# Patient Record
Sex: Male | Born: 1950 | Race: White | Hispanic: No | Marital: Married | Smoking: Former smoker
Health system: Southern US, Community
[De-identification: ages and names within clinical notes are randomized; demographics above are authoritative.]

## PROBLEM LIST (undated history)

## (undated) DIAGNOSIS — G4730 Sleep apnea, unspecified: Secondary | ICD-10-CM

## (undated) DIAGNOSIS — I10 Essential (primary) hypertension: Secondary | ICD-10-CM

## (undated) HISTORY — PX: PROSTATECTOMY: SHX69

## (undated) HISTORY — PX: HERNIA REPAIR: SHX51

---

## 2004-07-22 ENCOUNTER — Ambulatory Visit: Payer: Self-pay

## 2007-11-24 ENCOUNTER — Ambulatory Visit: Payer: Self-pay | Admitting: General Surgery

## 2008-10-11 ENCOUNTER — Ambulatory Visit: Payer: Self-pay | Admitting: *Deleted

## 2008-10-20 ENCOUNTER — Ambulatory Visit: Payer: Self-pay | Admitting: *Deleted

## 2008-10-26 ENCOUNTER — Ambulatory Visit: Payer: Self-pay | Admitting: *Deleted

## 2009-12-18 ENCOUNTER — Ambulatory Visit: Payer: Self-pay | Admitting: Surgery

## 2009-12-24 ENCOUNTER — Ambulatory Visit: Payer: Self-pay | Admitting: Surgery

## 2014-11-19 ENCOUNTER — Encounter: Payer: Self-pay | Admitting: General Practice

## 2014-11-19 ENCOUNTER — Emergency Department
Admission: EM | Admit: 2014-11-19 | Discharge: 2014-11-19 | Disposition: A | Discharge status: Home / Self Care | Payer: 59 | Attending: Emergency Medicine | Admitting: Emergency Medicine

## 2014-11-19 ENCOUNTER — Emergency Department: Payer: 59

## 2014-11-19 DIAGNOSIS — S61411A Laceration without foreign body of right hand, initial encounter: Secondary | ICD-10-CM | POA: Diagnosis not present

## 2014-11-19 DIAGNOSIS — T797XXA Traumatic subcutaneous emphysema, initial encounter: Secondary | ICD-10-CM | POA: Diagnosis not present

## 2014-11-19 DIAGNOSIS — Y998 Other external cause status: Secondary | ICD-10-CM | POA: Insufficient documentation

## 2014-11-19 DIAGNOSIS — I10 Essential (primary) hypertension: Secondary | ICD-10-CM | POA: Diagnosis not present

## 2014-11-19 DIAGNOSIS — Z23 Encounter for immunization: Secondary | ICD-10-CM | POA: Diagnosis not present

## 2014-11-19 DIAGNOSIS — Z87891 Personal history of nicotine dependence: Secondary | ICD-10-CM | POA: Diagnosis not present

## 2014-11-19 DIAGNOSIS — Y9389 Activity, other specified: Secondary | ICD-10-CM | POA: Insufficient documentation

## 2014-11-19 DIAGNOSIS — W228XXA Striking against or struck by other objects, initial encounter: Secondary | ICD-10-CM | POA: Insufficient documentation

## 2014-11-19 DIAGNOSIS — Y92009 Unspecified place in unspecified non-institutional (private) residence as the place of occurrence of the external cause: Secondary | ICD-10-CM | POA: Insufficient documentation

## 2014-11-19 HISTORY — DX: Essential (primary) hypertension: I10

## 2014-11-19 MED ORDER — LIDOCAINE HCL (PF) 1 % IJ SOLN
INTRAMUSCULAR | Status: AC
  Administered 2014-11-19: 2.1 mL
  Filled 2014-11-19: qty 5

## 2014-11-19 MED ORDER — BACITRACIN ZINC 500 UNIT/GM EX OINT
TOPICAL_OINTMENT | CUTANEOUS | Status: AC
  Administered 2014-11-19: 1
  Filled 2014-11-19: qty 1.8

## 2014-11-19 MED ORDER — CEFTRIAXONE SODIUM 1 G IJ SOLR
1.0000 g | Freq: Once | INTRAMUSCULAR | Status: AC
  Administered 2014-11-19: 1 g via INTRAMUSCULAR

## 2014-11-19 MED ORDER — CEPHALEXIN 500 MG PO CAPS: 500.0000 mg | ORAL_CAPSULE | Freq: Four times a day (QID) | ORAL | Status: AC

## 2014-11-19 MED ORDER — TETANUS-DIPHTH-ACELL PERTUSSIS 5-2.5-18.5 LF-MCG/0.5 IM SUSP
0.5000 mL | Freq: Once | INTRAMUSCULAR | Status: AC
  Administered 2014-11-19: 0.5 mL via INTRAMUSCULAR

## 2014-11-19 MED ORDER — IBUPROFEN 800 MG PO TABS: 800.0000 mg | ORAL_TABLET | Freq: Three times a day (TID) | ORAL | Status: AC | PRN

## 2014-11-19 MED ORDER — CEFTRIAXONE SODIUM 1 G IJ SOLR
INTRAMUSCULAR | Status: AC
  Administered 2014-11-19: 1 g via INTRAMUSCULAR
  Filled 2014-11-19: qty 10

## 2014-11-19 MED ORDER — TETANUS-DIPHTH-ACELL PERTUSSIS 5-2.5-18.5 LF-MCG/0.5 IM SUSP
INTRAMUSCULAR | Status: AC
  Administered 2014-11-19: 0.5 mL via INTRAMUSCULAR
  Filled 2014-11-19: qty 0.5

## 2014-11-19 NOTE — ED Notes (Signed)
Pt reports that he was at home pressure washing when his hand accident slip infront of machine. Laceration noted to top or Right hand. Bleeding controlled at this time. Alert and Oriented.

## 2014-11-19 NOTE — ED Notes (Signed)
NAD noted at time of D/C. Pt denies questions or concerns. Pt ambulatory to the lobby at this time.  

## 2014-11-19 NOTE — ED Provider Notes (Signed)
CSN: [id number]     Arrival date & time 11/19/14  1215 History   First MD Initiated Contact with Patient 11/19/14 1327     Chief Complaint  Patient presents with  . Laceration     (Consider location/radiation/quality/duration/timing/severity/associated sxs/prior Treatment) HPI  Prior to the patient's arrival in department he was cleaning his house was using a pressure washer his hand slipped in front of the gun opening up the laceration on the dorsal aspect of his right hand rates pain as about a 5 out of 10 any type of movement and closing his fist makes a little worse just holding it up and still makes it better mild swelling to the area no numbness tingling or weakness in the hand and no other complaints at this time   Past Medical History  Diagnosis Date  . Hypertension    History reviewed. No pertinent past surgical history. No family history on file. History  Substance Use Topics  . Smoking status: Former Research scientist (life sciences)  . Smokeless tobacco: Never Used  . Alcohol Use: Yes     Comment: casual    Review of Systems  Constitutional: Negative.   HENT: Negative.   Eyes: Negative.   Respiratory: Negative.   Cardiovascular: Negative.   Musculoskeletal: Negative.   Skin: Negative.   Neurological: Negative.   All other systems reviewed and are negative.     Allergies  Review of patient's allergies indicates no known allergies.  Home Medications   Prior to Admission medications   Medication Sig Start Date End Date Taking? Authorizing Provider  cephALEXin (KEFLEX) 500 MG capsule Take 1 capsule (500 mg total) by mouth 4 (four) times daily. 11/19/14 11/29/14  [place] Ganus William C Zechariah Bissonnette, PA-C  ibuprofen (ADVIL,MOTRIN) 800 MG tablet Take 1 tablet (800 mg total) by mouth every 8 (eight) hours as needed. 11/19/14   Eivan Gallina William C Zaylie Gisler, PA-C   BP 136/111 mmHg  Pulse 57  Temp(Src) 97.9 F (36.6 C)  Resp 18  Ht 6\' 2"  (1.88 m)  Wt 246 lb (111.585 kg)  BMI 31.57 kg/m2  SpO2  98% Physical Exam  Vitals reviewed  Constitutional: Alert and oriented. Well appearing and in no acute distress. Eyes: Conjunctivae are normal. PERRL. EOMI. Head: Atraumatic. Nose: No congestion/rhinnorhea. Mouth/Throat: Mucous membranes are moist.  Oropharynx non-erythematous. Neck: No stridor.   Cardiovascular: Normal rate, regular rhythm. Grossly normal heart sounds.  Good peripheral circulation. Respiratory: Normal respiratory effort.  No retractions. Lungs CTAB. Musculoskeletal: No lower extremity tenderness nor edema.  No joint effusions. Tenderness with palpation along the dorsal aspect of the right hand without palpable deformity or abnormality Neurologic:  Normal speech and language. No gross focal neurologic deficits are appreciated. Speech is normal. No gait instability. Skin:  Laceration to the dorsal aspect of his right hand no bleeding at this time mild swelling around the laceration Psychiatric: Mood and affect are normal. Speech and behavior are normal.   ED Course  Procedures (including critical care time) Labs Review Labs Reviewed - No data to display  Imaging Review Dg Hand Complete Right  11/19/2014   CLINICAL DATA:  Laceration to top of hand posteriorly. Initial encounter.  EXAM: RIGHT HAND - COMPLETE 3+ VIEW  COMPARISON:  None.  FINDINGS: Overlap of fingers on the lateral view. Soft tissue injury identified about the dorsal hand at the level of the carpals and metacarpals. No radiopaque foreign object.  IMPRESSION: Soft tissue injury dorsally, without acute osseous abnormality or radiopaque foreign object.   Electronically Signed  By: Abigail Miyamoto M.D.   On: 11/19/2014 13:52     EKG Interpretation None      MDM   Decision making on this patient he was at home pressure washing the pressure washer opened up the laceration to the dorsal aspect of his hand injecting water and air under the skin of his hand   Final diagnoses:  Laceration of right hand,  initial encounter  Subcutaneous emphysema, initial encounter        Pete Merten Verdene Rio, PA-C 11/19/14 [place], MD 11/22/14 1133

## 2015-08-10 ENCOUNTER — Encounter: Payer: Self-pay | Admitting: *Deleted

## 2015-08-13 ENCOUNTER — Encounter: Admission: RE | Payer: Self-pay | Source: Ambulatory Visit

## 2015-08-13 ENCOUNTER — Ambulatory Visit: Admission: RE | Admit: 2015-08-13 | Payer: 59 | Source: Ambulatory Visit | Admitting: Gastroenterology

## 2015-08-13 HISTORY — DX: Sleep apnea, unspecified: G47.30

## 2015-08-13 SURGERY — COLONOSCOPY WITH PROPOFOL
Anesthesia: General

## 2015-08-27 ENCOUNTER — Ambulatory Visit: Payer: 59 | Admitting: Anesthesiology

## 2015-08-27 ENCOUNTER — Ambulatory Visit
Admission: RE | Admit: 2015-08-27 | Discharge: 2015-08-27 | Disposition: A | Discharge status: Home / Self Care | Payer: 59 | Source: Ambulatory Visit | Attending: Gastroenterology | Admitting: Gastroenterology

## 2015-08-27 ENCOUNTER — Encounter
Admission: RE | Disposition: A | Discharge status: Home / Self Care | Payer: Self-pay | Source: Ambulatory Visit | Attending: Gastroenterology

## 2015-08-27 DIAGNOSIS — Z86010 Personal history of colonic polyps: Secondary | ICD-10-CM | POA: Diagnosis present

## 2015-08-27 DIAGNOSIS — Z1211 Encounter for screening for malignant neoplasm of colon: Secondary | ICD-10-CM | POA: Insufficient documentation

## 2015-08-27 DIAGNOSIS — Z791 Long term (current) use of non-steroidal anti-inflammatories (NSAID): Secondary | ICD-10-CM | POA: Diagnosis not present

## 2015-08-27 DIAGNOSIS — G4730 Sleep apnea, unspecified: Secondary | ICD-10-CM | POA: Insufficient documentation

## 2015-08-27 DIAGNOSIS — Z79899 Other long term (current) drug therapy: Secondary | ICD-10-CM | POA: Diagnosis not present

## 2015-08-27 DIAGNOSIS — Z87891 Personal history of nicotine dependence: Secondary | ICD-10-CM | POA: Diagnosis not present

## 2015-08-27 DIAGNOSIS — Z800 Family history of malignant neoplasm of digestive organs: Secondary | ICD-10-CM | POA: Diagnosis not present

## 2015-08-27 DIAGNOSIS — K5730 Diverticulosis of large intestine without perforation or abscess without bleeding: Secondary | ICD-10-CM | POA: Diagnosis not present

## 2015-08-27 DIAGNOSIS — Z9079 Acquired absence of other genital organ(s): Secondary | ICD-10-CM | POA: Diagnosis not present

## 2015-08-27 DIAGNOSIS — I10 Essential (primary) hypertension: Secondary | ICD-10-CM | POA: Insufficient documentation

## 2015-08-27 DIAGNOSIS — Z7951 Long term (current) use of inhaled steroids: Secondary | ICD-10-CM | POA: Diagnosis not present

## 2015-08-27 DIAGNOSIS — D123 Benign neoplasm of transverse colon: Secondary | ICD-10-CM | POA: Diagnosis not present

## 2015-08-27 HISTORY — PX: COLONOSCOPY WITH PROPOFOL: SHX5780

## 2015-08-27 SURGERY — COLONOSCOPY WITH PROPOFOL
Anesthesia: General

## 2015-08-27 MED ORDER — PROPOFOL 500 MG/50ML IV EMUL
INTRAVENOUS | Status: DC | PRN
Start: 2015-08-27 — End: 2015-08-27
  Administered 2015-08-27: 175 ug/kg/min via INTRAVENOUS

## 2015-08-27 MED ORDER — SODIUM CHLORIDE 0.9 % IV SOLN
INTRAVENOUS | Status: DC
  Administered 2015-08-27: 1000 mL via INTRAVENOUS

## 2015-08-27 MED ORDER — MIDAZOLAM HCL 2 MG/2ML IJ SOLN
INTRAMUSCULAR | Status: DC | PRN
  Administered 2015-08-27 (×2): 1 mg via INTRAVENOUS

## 2015-08-27 MED ORDER — LIDOCAINE HCL (CARDIAC) 20 MG/ML IV SOLN
INTRAVENOUS | Status: DC | PRN
  Administered 2015-08-27: 40 mg via INTRAVENOUS

## 2015-08-27 MED ORDER — FENTANYL CITRATE (PF) 100 MCG/2ML IJ SOLN
INTRAMUSCULAR | Status: DC | PRN
  Administered 2015-08-27: 50 ug via INTRAVENOUS

## 2015-08-27 MED ORDER — PROPOFOL 10 MG/ML IV BOLUS
INTRAVENOUS | Status: DC | PRN
  Administered 2015-08-27: 30 mg via INTRAVENOUS
  Administered 2015-08-27: 50 mg via INTRAVENOUS

## 2015-08-27 NOTE — Anesthesia Postprocedure Evaluation (Signed)
Anesthesia Post Note  Patient: Bradley Malone  Procedure(s) Performed: Procedure(s) (LRB): COLONOSCOPY WITH PROPOFOL (N/A)  Patient location during evaluation: Endoscopy Anesthesia Type: General Level of consciousness: awake and alert Pain management: pain level controlled Vital Signs Assessment: post-procedure vital signs reviewed and stable Respiratory status: spontaneous breathing, nonlabored ventilation, respiratory function stable and patient connected to nasal cannula oxygen Cardiovascular status: blood pressure returned to baseline and stable Postop Assessment: no signs of nausea or vomiting Anesthetic complications: no    Last Vitals:  Filed Vitals:   08/27/15 1017 08/27/15 1027  BP: 113/86 126/88  Pulse: 60 56  Temp:    Resp: 13 15    Last Pain: There were no vitals filed for this visit.               Martha Clan

## 2015-08-27 NOTE — Op Note (Signed)
[facility] Patient Name: Bradley Malone Procedure Date: 08/27/2015 9:24 AM MRN: [id number] Account #: [id number] Date of Birth: Oct 24, 1950 Admit Type: Outpatient Age: 65 Room: [facility] ENDO ROOM 3 Gender: Male Note Status: Finalized Procedure:            Colonoscopy Indications:          Surveillance: Personal history of adenomatous polyps on                        last colonoscopy > 5 years ago, Family history of colon                        cancer in a distant relative, Family history of colonic                        polyps in a first-degree relative Patient Profile:      This is a 65 year old male. Providers:            Gerrit Heck. Rayann Heman, MD Referring MD:         Shirline Frees (Referring MD) Medicines:            Propofol per Anesthesia Complications:        No immediate complications. Procedure:            Pre-Anesthesia Assessment:                       - Prior to the procedure, a History and Physical was                        performed, and patient medications, allergies and                        sensitivities were reviewed. The patient's tolerance of                        previous anesthesia was reviewed.                       After obtaining informed consent, the colonoscope was                        passed under direct vision. Throughout the procedure,                        the patient's blood pressure, pulse, and oxygen                        saturations were monitored continuously. The                        Colonoscope was introduced through the anus and                        advanced to the the cecum, identified by appendiceal                        orifice and ileocecal valve. The colonoscopy was                        performed without difficulty. The patient  tolerated the                        procedure well. The quality of the bowel preparation                        was good. Findings:      The perianal and digital rectal  examinations were normal.      A 3 mm polyp was found in the transverse colon. The polyp was sessile.       The polyp was removed with a jumbo cold forceps. Resection and retrieval       were complete.      A few small-mouthed diverticula were found in the sigmoid colon.      Anal papilla(e) were hypertrophied.      The exam was otherwise without abnormality. Impression:           - One 3 mm polyp in the transverse colon, removed with                        a jumbo cold forceps. Resected and retrieved.                       - Diverticulosis in the sigmoid colon.                       - Anal papilla(e) were hypertrophied.                       - The examination was otherwise normal. Recommendation:       - Observe patient in GI recovery unit.                       - Continue present medications.                       - Await pathology results.                       - Repeat colonoscopy for surveillance based on                        pathology results.                       - Return to referring physician.                       - The findings and recommendations were discussed with                        the patient.                       - The findings and recommendations were discussed with                        the patient's family. Procedure Code(s):    --- Professional ---                       [phone], Colonoscopy, flexible; with biopsy, single or  multiple Diagnosis Code(s):    --- Professional ---                       Z86.010, Personal history of colonic polyps                       D12.3, Benign neoplasm of transverse colon (hepatic                        flexure or splenic flexure)                       K62.89, Other specified diseases of anus and rectum                       Z80.0, Family history of malignant neoplasm of                        digestive organs                       Z83.71, Family history of colonic polyps                       K57.30,  Diverticulosis of large intestine without                        perforation or abscess without bleeding CPT copyright 2016 American [employer]. All rights reserved. The codes documented in this report are preliminary and upon coder review may  be revised to meet current compliance requirements. Mellody Life, MD 08/27/2015 9:55:59 AM This report has been signed electronically. Number of Addenda: 0 Note Initiated On: 08/27/2015 9:24 AM Scope Withdrawal Time: 0 hours 15 minutes 14 seconds  Total Procedure Duration: 0 hours 18 minutes 39 seconds       [facility]

## 2015-08-27 NOTE — Transfer of Care (Signed)
Immediate Anesthesia Transfer of Care Note  Patient: Bradley Malone  Procedure(s) Performed: Procedure(s): COLONOSCOPY WITH PROPOFOL (N/A)  Patient Location: PACU and Endoscopy Unit  Anesthesia Type:General  Level of Consciousness: sedated  Airway & Oxygen Therapy: Patient Spontanous Breathing and Patient connected to nasal cannula oxygen  Post-op Assessment: Report given to RN and Post -op Vital signs reviewed and stable  Post vital signs: Reviewed and stable  Last Vitals:  Filed Vitals:   08/27/15 0903 08/27/15 0958  BP: 135/91 92/46  Pulse: 71 61  Temp: 37.3 C 36.2 C  Resp: 16 16    Complications: No apparent anesthesia complications

## 2015-08-27 NOTE — Anesthesia Procedure Notes (Signed)
Date/Time: 08/27/2015 9:26 AM Performed by: Doreen Salvage Pre-anesthesia Checklist: Patient identified, Emergency Drugs available, Suction available and Patient being monitored Patient Re-evaluated:Patient Re-evaluated prior to inductionOxygen Delivery Method: Nasal cannula Intubation Type: IV induction Dental Injury: Teeth and Oropharynx as per pre-operative assessment  Comments: Nasal cannula with etCO2 monitoring

## 2015-08-27 NOTE — Discharge Instructions (Signed)

## 2015-08-27 NOTE — Anesthesia Preprocedure Evaluation (Signed)
Anesthesia Evaluation  Patient identified by MRN, date of birth, ID band Patient awake    Reviewed: Allergy & Precautions, H&P , NPO status , Patient's Chart, lab work & pertinent test results, reviewed documented beta blocker date and time   History of Anesthesia Complications Negative for: history of anesthetic complications  Airway Mallampati: III  TM Distance: >3 FB Neck ROM: full    Dental no notable dental hx. (+) Caps   Pulmonary neg shortness of breath, sleep apnea and Continuous Positive Airway Pressure Ventilation , neg COPD, Recent URI , former smoker,    Pulmonary exam normal breath sounds clear to auscultation       Cardiovascular Exercise Tolerance: Good hypertension, (-) angina(-) CAD, (-) Past MI, (-) Cardiac Stents and (-) CABG Normal cardiovascular exam(-) dysrhythmias (-) Valvular Problems/Murmurs Rhythm:regular Rate:Normal     Neuro/Psych negative neurological ROS  negative psych ROS   GI/Hepatic negative GI ROS, Neg liver ROS,   Endo/Other  negative endocrine ROS  Renal/GU negative Renal ROS  negative genitourinary   Musculoskeletal   Abdominal   Peds  Hematology negative hematology ROS (+)   Anesthesia Other Findings Past Medical History:   Hypertension                                                 Sleep apnea                                                    Comment:uses CPAP   Reproductive/Obstetrics negative OB ROS                             Anesthesia Physical Anesthesia Plan  ASA: II  Anesthesia Plan: General   Post-op Pain Management:    Induction:   Airway Management Planned:   Additional Equipment:   Intra-op Plan:   Post-operative Plan:   Informed Consent: I have reviewed the patients History and Physical, chart, labs and discussed the procedure including the risks, benefits and alternatives for the proposed anesthesia with the patient or  authorized representative who has indicated his/her understanding and acceptance.   Dental Advisory Given  Plan Discussed with: Anesthesiologist, CRNA and Surgeon  Anesthesia Plan Comments:         Anesthesia Quick Evaluation

## 2015-08-27 NOTE — H&P (Signed)
  Primary Care Physician:  Sherrin Daisy, MD  Pre-Procedure History & Physical: HPI:  Bradley Malone is a 65 y.o. male is here for an colonoscopy.   Past Medical History  Diagnosis Date  . Hypertension   . Sleep apnea     uses CPAP    Past Surgical History  Procedure Laterality Date  . Prostatectomy    . Hernia repair      x2    Prior to Admission medications   Medication Sig Start Date End Date Taking? Authorizing Provider  citalopram (CELEXA) 20 MG tablet Take 20 mg by mouth daily.    Historical Provider, MD  fluticasone (FLONASE) 50 MCG/ACT nasal spray Place into both nostrils daily.    Historical Provider, MD  ibuprofen (ADVIL,MOTRIN) 800 MG tablet Take 1 tablet (800 mg total) by mouth every 8 (eight) hours as needed. 11/19/14   III William C Ruffian, PA-C  lisinopril-hydrochlorothiazide (PRINZIDE,ZESTORETIC) 20-25 MG tablet Take 1 tablet by mouth daily.    Historical Provider, MD  Multiple Vitamins-Minerals (MULTIVITAMIN ADULT PO) Take by mouth.    Historical Provider, MD    Allergies as of 08/15/2015  . (No Known Allergies)    History reviewed. No pertinent family history.  Social History   Social History  . Marital Status: Married    Spouse Name: N/A  . Number of Children: N/A  . Years of Education: N/A   Occupational History  . Not on file.   Social History Main Topics  . Smoking status: Former Research scientist (life sciences)  . Smokeless tobacco: Never Used  . Alcohol Use: Yes     Comment: casual  . Drug Use: No  . Sexual Activity: Not on file   Other Topics Concern  . Not on file   Social History Narrative     Physical Exam: BP 135/91 mmHg  Pulse 71  Temp(Src) 99.1 F (37.3 C) (Tympanic)  Resp 16  Ht 6' 2.5" (1.892 m)  Wt 113.399 kg (250 lb)  BMI 31.68 kg/m2  SpO2 99% General:   Alert,  pleasant and cooperative in NAD Head:  Normocephalic and atraumatic. Neck:  Supple; no masses or thyromegaly. Lungs:  Clear throughout to auscultation.    Heart:  Regular  rate and rhythm. Abdomen:  Soft, nontender and nondistended. Normal bowel sounds, without guarding, and without rebound.   Neurologic:  Alert and  oriented x4;  grossly normal neurologically.  Impression/Plan: Bradley Malone is here for an colonoscopy to be performed for polyp surveillance  Risks, benefits, limitations, and alternatives regarding  colonoscopy have been reviewed with the patient.  Questions have been answered.  All parties agreeable.   Josefine Class, MD  08/27/2015, 9:22 AM

## 2015-08-28 LAB — SURGICAL PATHOLOGY

## 2015-08-29 ENCOUNTER — Encounter: Payer: Self-pay | Admitting: Gastroenterology

## 2016-07-22 ENCOUNTER — Ambulatory Visit: Payer: 59 | Admitting: Dietician

## 2016-09-07 IMAGING — CR DG HAND COMPLETE 3+V*R*
1 series · 3 of 3 positions shown · non-contrast
Comparison: None.

CLINICAL DATA: Laceration to top of hand posteriorly. Initial
encounter.

EXAM:
RIGHT HAND - COMPLETE 3+ VIEW

[Series 1: pa · 0.17mm/px · 3 of 3 slices shown]
[im 1/3]
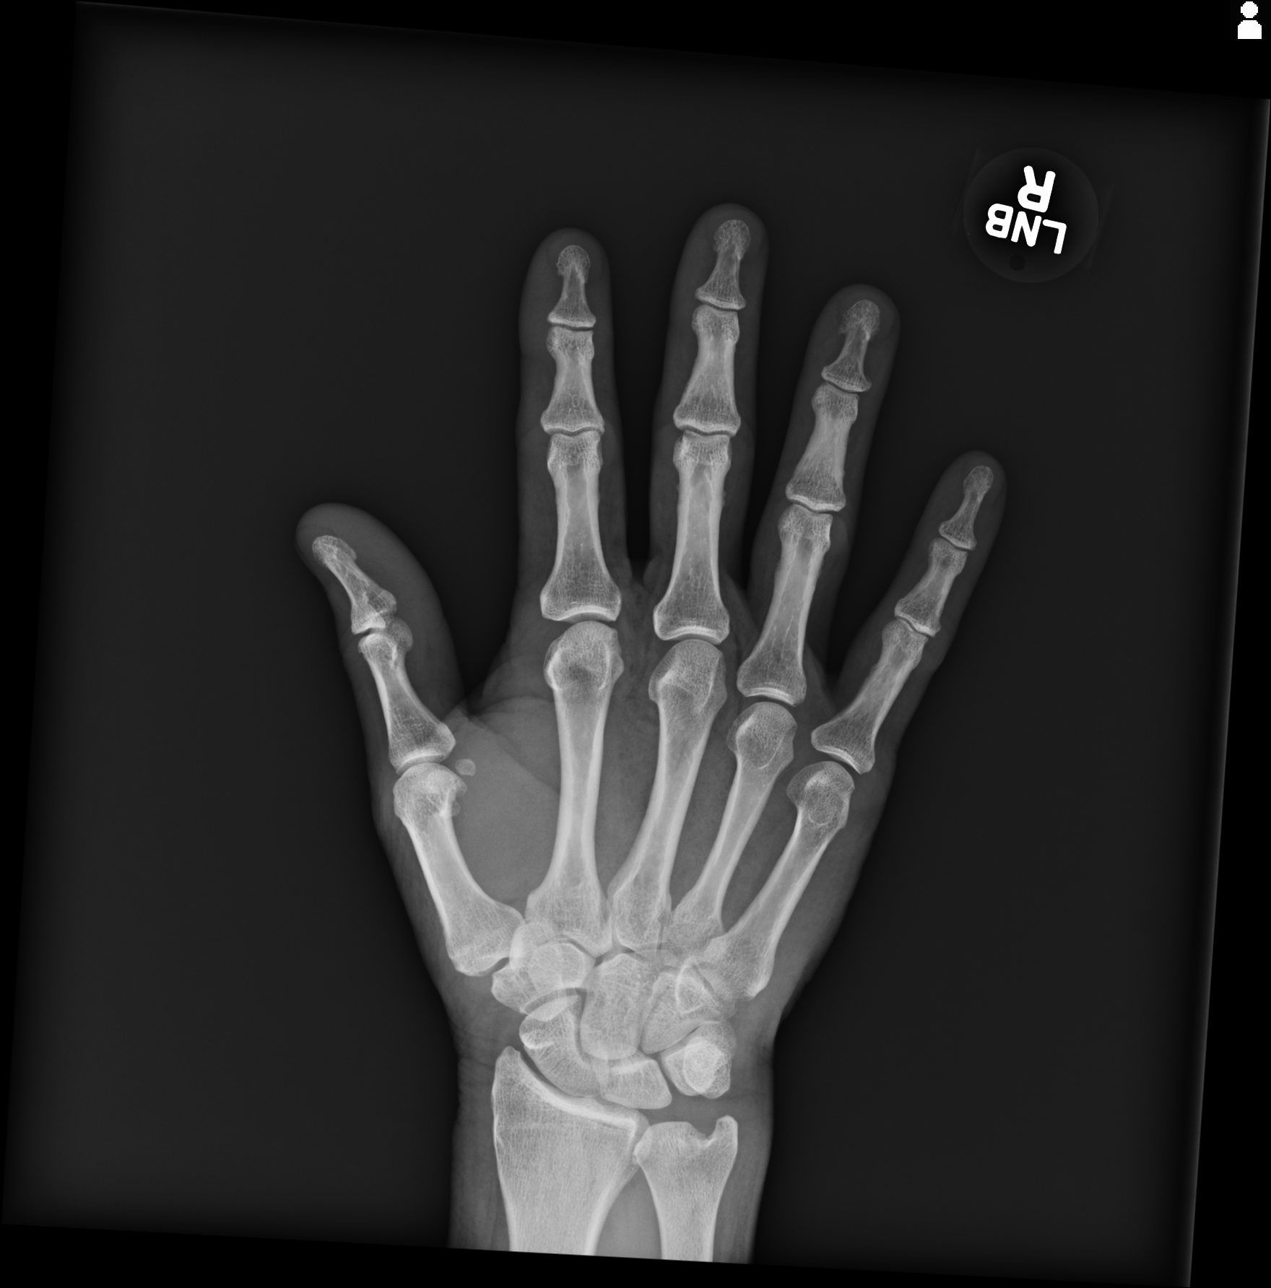
[im 2/3]
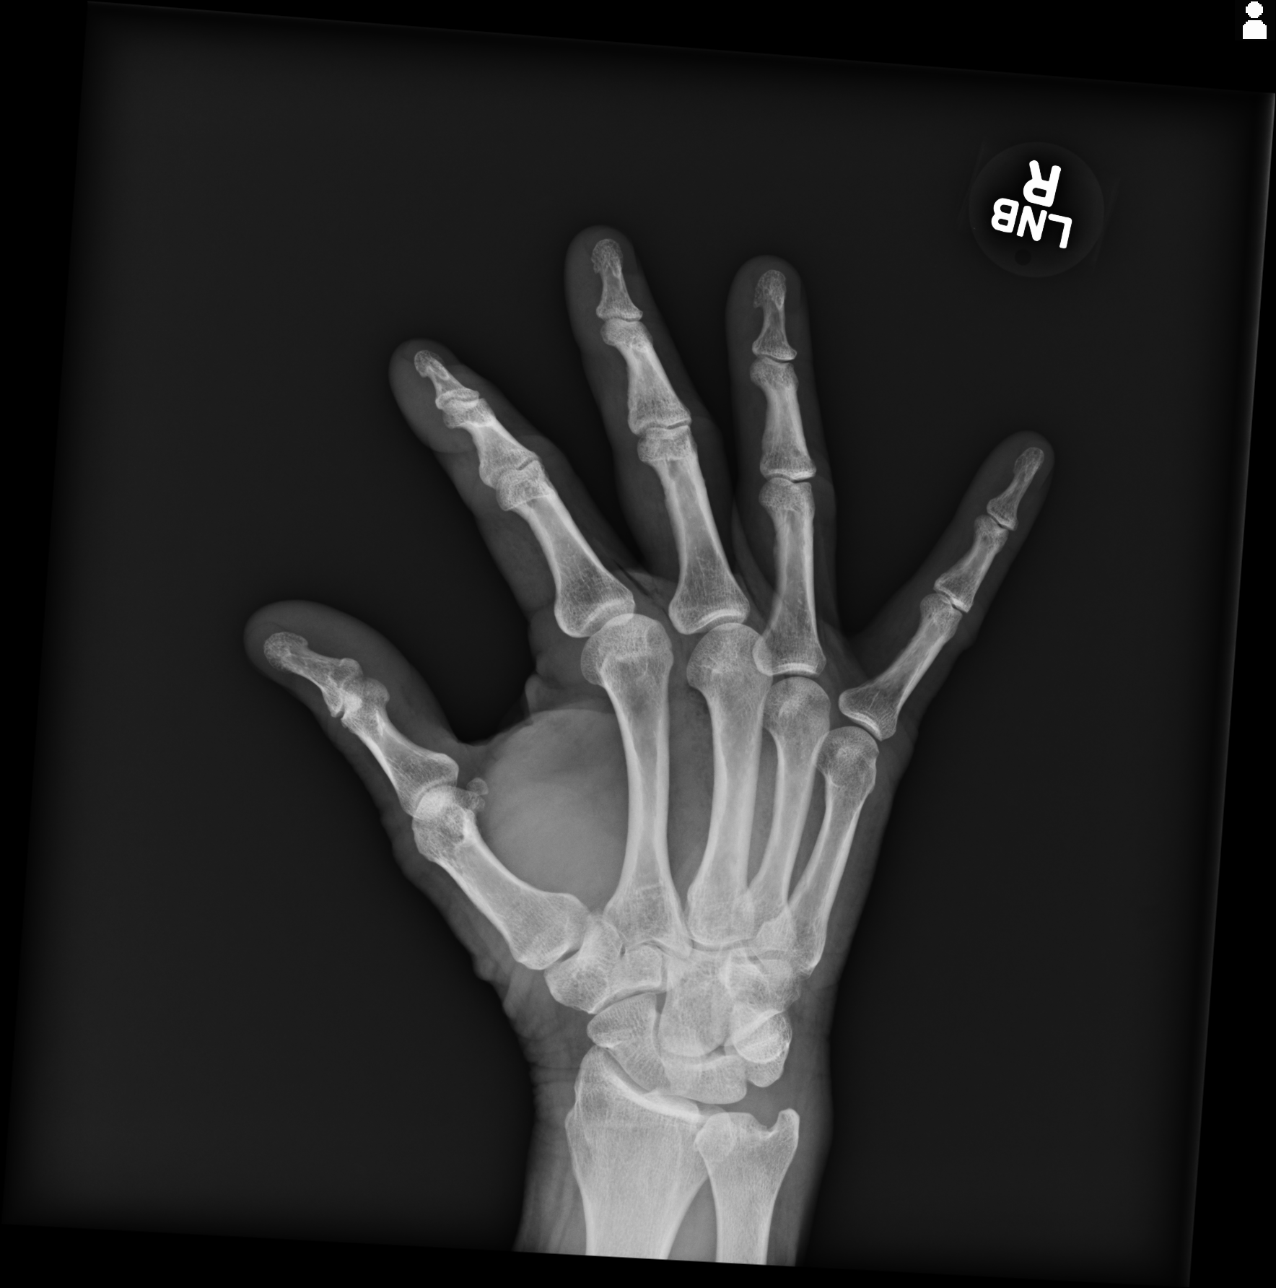
[im 3/3]
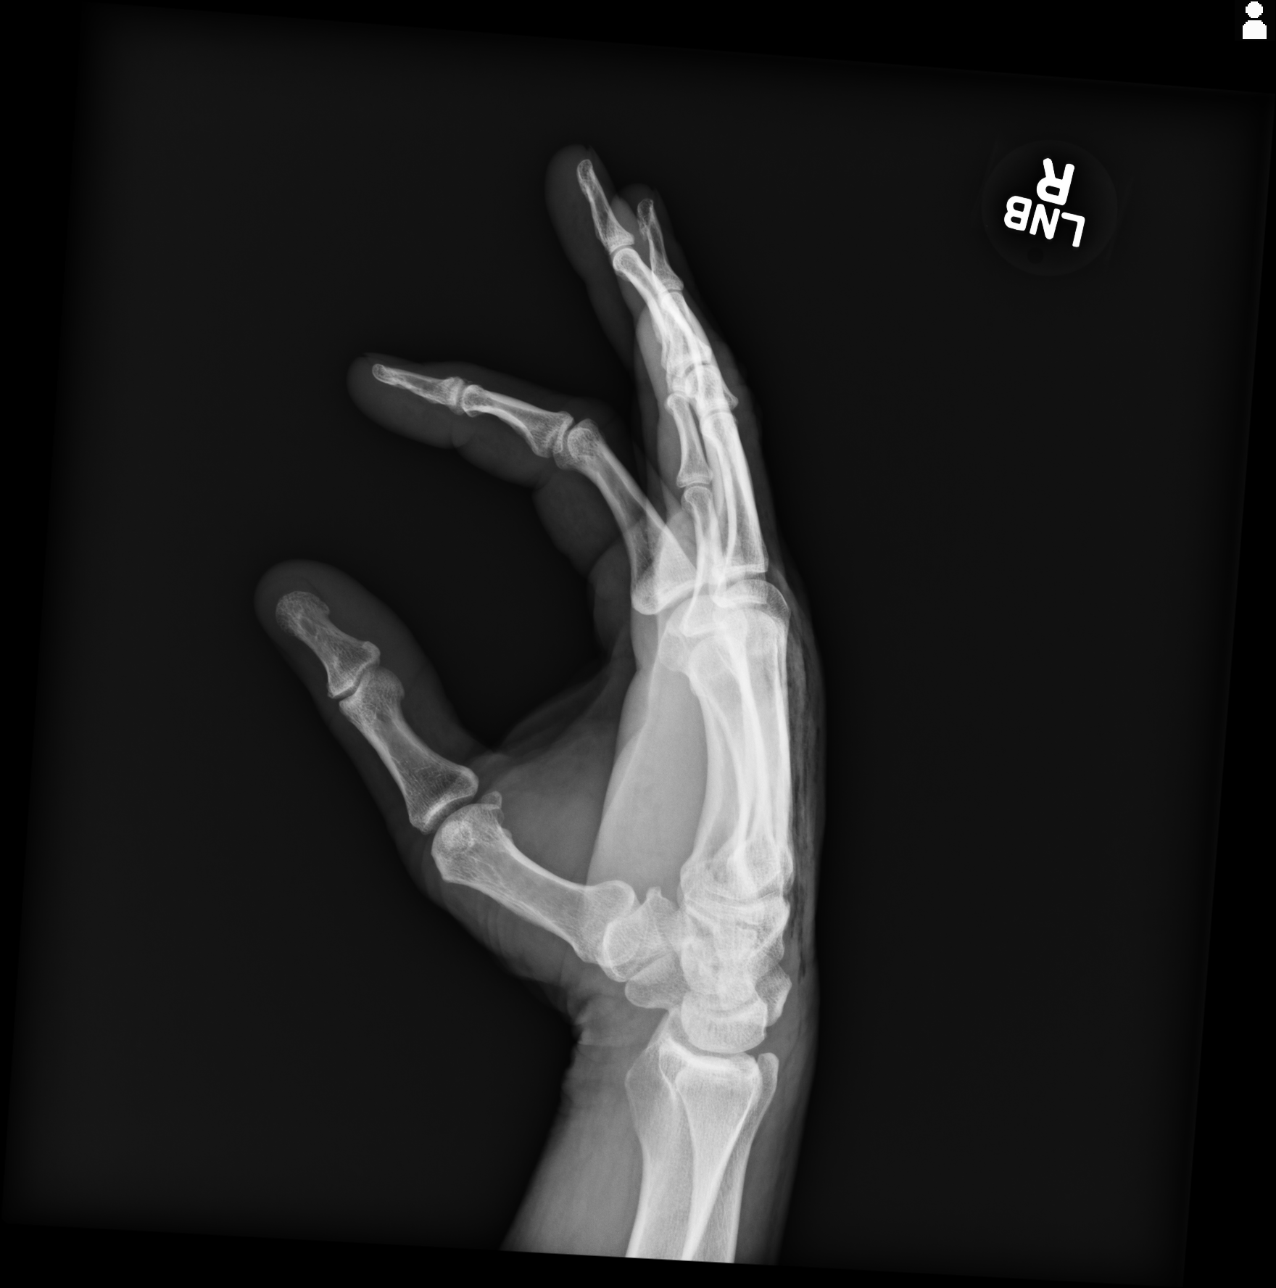

[3 of 3 positions shown; findings below may reference images not displayed]

FINDINGS: Overlap of fingers on the lateral view. Soft tissue injury
identified about the dorsal hand at the level of the carpals and
metacarpals. No radiopaque foreign object.
IMPRESSION: Soft tissue injury dorsally, without acute osseous abnormality or
radiopaque foreign object.

## 2016-10-15 DIAGNOSIS — C44622 Squamous cell carcinoma of skin of right upper limb, including shoulder: Secondary | ICD-10-CM | POA: Diagnosis not present

## 2016-11-07 DIAGNOSIS — E119 Type 2 diabetes mellitus without complications: Secondary | ICD-10-CM | POA: Diagnosis not present

## 2017-01-26 DIAGNOSIS — Z23 Encounter for immunization: Secondary | ICD-10-CM | POA: Diagnosis not present

## 2017-01-26 DIAGNOSIS — M7022 Olecranon bursitis, left elbow: Secondary | ICD-10-CM | POA: Diagnosis not present

## 2017-04-14 DIAGNOSIS — E7800 Pure hypercholesterolemia, unspecified: Secondary | ICD-10-CM | POA: Diagnosis not present

## 2017-04-14 DIAGNOSIS — G4733 Obstructive sleep apnea (adult) (pediatric): Secondary | ICD-10-CM | POA: Diagnosis not present

## 2017-04-14 DIAGNOSIS — Z1159 Encounter for screening for other viral diseases: Secondary | ICD-10-CM | POA: Diagnosis not present

## 2017-04-14 DIAGNOSIS — E1165 Type 2 diabetes mellitus with hyperglycemia: Secondary | ICD-10-CM | POA: Diagnosis not present

## 2017-04-14 DIAGNOSIS — Z9989 Dependence on other enabling machines and devices: Secondary | ICD-10-CM | POA: Diagnosis not present

## 2017-04-14 DIAGNOSIS — I10 Essential (primary) hypertension: Secondary | ICD-10-CM | POA: Diagnosis not present

## 2017-04-14 DIAGNOSIS — M7022 Olecranon bursitis, left elbow: Secondary | ICD-10-CM | POA: Diagnosis not present

## 2017-04-14 DIAGNOSIS — Z79899 Other long term (current) drug therapy: Secondary | ICD-10-CM | POA: Diagnosis not present

## 2017-04-14 DIAGNOSIS — Z114 Encounter for screening for human immunodeficiency virus [HIV]: Secondary | ICD-10-CM | POA: Diagnosis not present

## 2017-04-14 DIAGNOSIS — Z23 Encounter for immunization: Secondary | ICD-10-CM | POA: Diagnosis not present

## 2017-04-14 DIAGNOSIS — F419 Anxiety disorder, unspecified: Secondary | ICD-10-CM | POA: Diagnosis not present

## 2017-05-15 DIAGNOSIS — E119 Type 2 diabetes mellitus without complications: Secondary | ICD-10-CM | POA: Diagnosis not present

## 2017-08-17 DIAGNOSIS — M531 Cervicobrachial syndrome: Secondary | ICD-10-CM | POA: Diagnosis not present

## 2017-08-17 DIAGNOSIS — M9901 Segmental and somatic dysfunction of cervical region: Secondary | ICD-10-CM | POA: Diagnosis not present

## 2017-08-21 DIAGNOSIS — M9901 Segmental and somatic dysfunction of cervical region: Secondary | ICD-10-CM | POA: Diagnosis not present

## 2017-08-21 DIAGNOSIS — M531 Cervicobrachial syndrome: Secondary | ICD-10-CM | POA: Diagnosis not present

## 2017-09-08 DIAGNOSIS — J0100 Acute maxillary sinusitis, unspecified: Secondary | ICD-10-CM | POA: Diagnosis not present

## 2017-10-14 DIAGNOSIS — Z86018 Personal history of other benign neoplasm: Secondary | ICD-10-CM | POA: Diagnosis not present

## 2017-10-14 DIAGNOSIS — L570 Actinic keratosis: Secondary | ICD-10-CM | POA: Diagnosis not present

## 2017-10-14 DIAGNOSIS — L578 Other skin changes due to chronic exposure to nonionizing radiation: Secondary | ICD-10-CM | POA: Diagnosis not present

## 2017-10-14 DIAGNOSIS — Z859 Personal history of malignant neoplasm, unspecified: Secondary | ICD-10-CM | POA: Diagnosis not present

## 2017-10-14 DIAGNOSIS — Z872 Personal history of diseases of the skin and subcutaneous tissue: Secondary | ICD-10-CM | POA: Diagnosis not present

## 2017-11-18 DIAGNOSIS — E119 Type 2 diabetes mellitus without complications: Secondary | ICD-10-CM | POA: Diagnosis not present

## 2018-04-02 DIAGNOSIS — E7800 Pure hypercholesterolemia, unspecified: Secondary | ICD-10-CM | POA: Diagnosis not present

## 2018-04-02 DIAGNOSIS — Z9989 Dependence on other enabling machines and devices: Secondary | ICD-10-CM | POA: Diagnosis not present

## 2018-04-02 DIAGNOSIS — E1165 Type 2 diabetes mellitus with hyperglycemia: Secondary | ICD-10-CM | POA: Diagnosis not present

## 2018-04-02 DIAGNOSIS — F419 Anxiety disorder, unspecified: Secondary | ICD-10-CM | POA: Diagnosis not present

## 2018-04-02 DIAGNOSIS — G4733 Obstructive sleep apnea (adult) (pediatric): Secondary | ICD-10-CM | POA: Diagnosis not present

## 2018-04-02 DIAGNOSIS — I10 Essential (primary) hypertension: Secondary | ICD-10-CM | POA: Diagnosis not present

## 2018-05-25 DIAGNOSIS — E119 Type 2 diabetes mellitus without complications: Secondary | ICD-10-CM | POA: Diagnosis not present

## 2018-10-05 DIAGNOSIS — Z0000 Encounter for general adult medical examination without abnormal findings: Secondary | ICD-10-CM | POA: Diagnosis not present

## 2018-10-05 DIAGNOSIS — M791 Myalgia, unspecified site: Secondary | ICD-10-CM | POA: Diagnosis not present

## 2018-10-05 DIAGNOSIS — E7800 Pure hypercholesterolemia, unspecified: Secondary | ICD-10-CM | POA: Diagnosis not present

## 2018-10-05 DIAGNOSIS — I10 Essential (primary) hypertension: Secondary | ICD-10-CM | POA: Diagnosis not present

## 2018-10-05 DIAGNOSIS — Z9989 Dependence on other enabling machines and devices: Secondary | ICD-10-CM | POA: Diagnosis not present

## 2018-10-05 DIAGNOSIS — G4733 Obstructive sleep apnea (adult) (pediatric): Secondary | ICD-10-CM | POA: Diagnosis not present

## 2018-10-05 DIAGNOSIS — E1165 Type 2 diabetes mellitus with hyperglycemia: Secondary | ICD-10-CM | POA: Diagnosis not present

## 2018-10-05 DIAGNOSIS — Z8546 Personal history of malignant neoplasm of prostate: Secondary | ICD-10-CM | POA: Diagnosis not present

## 2018-10-05 DIAGNOSIS — F419 Anxiety disorder, unspecified: Secondary | ICD-10-CM | POA: Diagnosis not present

## 2018-11-16 DIAGNOSIS — E119 Type 2 diabetes mellitus without complications: Secondary | ICD-10-CM | POA: Diagnosis not present

## 2018-11-23 DIAGNOSIS — E119 Type 2 diabetes mellitus without complications: Secondary | ICD-10-CM | POA: Diagnosis not present

## 2019-01-27 DIAGNOSIS — S61202A Unspecified open wound of right middle finger without damage to nail, initial encounter: Secondary | ICD-10-CM | POA: Diagnosis not present

## 2019-01-27 DIAGNOSIS — S6991XA Unspecified injury of right wrist, hand and finger(s), initial encounter: Secondary | ICD-10-CM | POA: Diagnosis not present

## 2019-02-21 DIAGNOSIS — C4441 Basal cell carcinoma of skin of scalp and neck: Secondary | ICD-10-CM | POA: Diagnosis not present

## 2019-02-21 DIAGNOSIS — D2271 Melanocytic nevi of right lower limb, including hip: Secondary | ICD-10-CM | POA: Diagnosis not present

## 2019-02-21 DIAGNOSIS — D2272 Melanocytic nevi of left lower limb, including hip: Secondary | ICD-10-CM | POA: Diagnosis not present

## 2019-02-21 DIAGNOSIS — D044 Carcinoma in situ of skin of scalp and neck: Secondary | ICD-10-CM | POA: Diagnosis not present

## 2019-02-21 DIAGNOSIS — D225 Melanocytic nevi of trunk: Secondary | ICD-10-CM | POA: Diagnosis not present

## 2019-02-21 DIAGNOSIS — D485 Neoplasm of uncertain behavior of skin: Secondary | ICD-10-CM | POA: Diagnosis not present

## 2019-02-21 DIAGNOSIS — I788 Other diseases of capillaries: Secondary | ICD-10-CM | POA: Diagnosis not present

## 2019-02-21 DIAGNOSIS — L570 Actinic keratosis: Secondary | ICD-10-CM | POA: Diagnosis not present

## 2019-02-21 DIAGNOSIS — X32XXXA Exposure to sunlight, initial encounter: Secondary | ICD-10-CM | POA: Diagnosis not present

## 2019-04-07 DIAGNOSIS — C4441 Basal cell carcinoma of skin of scalp and neck: Secondary | ICD-10-CM | POA: Diagnosis not present

## 2019-04-11 DIAGNOSIS — F419 Anxiety disorder, unspecified: Secondary | ICD-10-CM | POA: Diagnosis not present

## 2019-04-11 DIAGNOSIS — Z9989 Dependence on other enabling machines and devices: Secondary | ICD-10-CM | POA: Diagnosis not present

## 2019-04-11 DIAGNOSIS — E1165 Type 2 diabetes mellitus with hyperglycemia: Secondary | ICD-10-CM | POA: Diagnosis not present

## 2019-04-11 DIAGNOSIS — Z8546 Personal history of malignant neoplasm of prostate: Secondary | ICD-10-CM | POA: Diagnosis not present

## 2019-04-11 DIAGNOSIS — G4733 Obstructive sleep apnea (adult) (pediatric): Secondary | ICD-10-CM | POA: Diagnosis not present

## 2019-04-11 DIAGNOSIS — Z23 Encounter for immunization: Secondary | ICD-10-CM | POA: Diagnosis not present

## 2019-04-11 DIAGNOSIS — E7800 Pure hypercholesterolemia, unspecified: Secondary | ICD-10-CM | POA: Diagnosis not present

## 2019-04-11 DIAGNOSIS — I10 Essential (primary) hypertension: Secondary | ICD-10-CM | POA: Diagnosis not present

## 2019-04-14 DIAGNOSIS — D044 Carcinoma in situ of skin of scalp and neck: Secondary | ICD-10-CM | POA: Diagnosis not present

## 2019-04-14 DIAGNOSIS — L570 Actinic keratosis: Secondary | ICD-10-CM | POA: Diagnosis not present

## 2019-06-20 DIAGNOSIS — E119 Type 2 diabetes mellitus without complications: Secondary | ICD-10-CM | POA: Diagnosis not present

## 2019-06-27 DIAGNOSIS — E119 Type 2 diabetes mellitus without complications: Secondary | ICD-10-CM | POA: Diagnosis not present

## 2019-10-17 DIAGNOSIS — L570 Actinic keratosis: Secondary | ICD-10-CM | POA: Diagnosis not present

## 2019-10-17 DIAGNOSIS — D2272 Melanocytic nevi of left lower limb, including hip: Secondary | ICD-10-CM | POA: Diagnosis not present

## 2019-10-17 DIAGNOSIS — D2262 Melanocytic nevi of left upper limb, including shoulder: Secondary | ICD-10-CM | POA: Diagnosis not present

## 2019-10-17 DIAGNOSIS — L821 Other seborrheic keratosis: Secondary | ICD-10-CM | POA: Diagnosis not present

## 2019-10-17 DIAGNOSIS — D225 Melanocytic nevi of trunk: Secondary | ICD-10-CM | POA: Diagnosis not present

## 2019-10-17 DIAGNOSIS — D2261 Melanocytic nevi of right upper limb, including shoulder: Secondary | ICD-10-CM | POA: Diagnosis not present

## 2019-10-17 DIAGNOSIS — Z85828 Personal history of other malignant neoplasm of skin: Secondary | ICD-10-CM | POA: Diagnosis not present

## 2019-10-17 DIAGNOSIS — X32XXXA Exposure to sunlight, initial encounter: Secondary | ICD-10-CM | POA: Diagnosis not present

## 2019-10-17 DIAGNOSIS — D485 Neoplasm of uncertain behavior of skin: Secondary | ICD-10-CM | POA: Diagnosis not present

## 2019-12-28 DIAGNOSIS — E119 Type 2 diabetes mellitus without complications: Secondary | ICD-10-CM | POA: Diagnosis not present

## 2020-01-18 DIAGNOSIS — Z8546 Personal history of malignant neoplasm of prostate: Secondary | ICD-10-CM | POA: Diagnosis not present

## 2020-01-18 DIAGNOSIS — G4733 Obstructive sleep apnea (adult) (pediatric): Secondary | ICD-10-CM | POA: Diagnosis not present

## 2020-01-18 DIAGNOSIS — Z0000 Encounter for general adult medical examination without abnormal findings: Secondary | ICD-10-CM | POA: Diagnosis not present

## 2020-01-18 DIAGNOSIS — E7800 Pure hypercholesterolemia, unspecified: Secondary | ICD-10-CM | POA: Diagnosis not present

## 2020-01-18 DIAGNOSIS — I10 Essential (primary) hypertension: Secondary | ICD-10-CM | POA: Diagnosis not present

## 2020-01-18 DIAGNOSIS — Z9989 Dependence on other enabling machines and devices: Secondary | ICD-10-CM | POA: Diagnosis not present

## 2020-01-18 DIAGNOSIS — E119 Type 2 diabetes mellitus without complications: Secondary | ICD-10-CM | POA: Diagnosis not present

## 2020-01-18 DIAGNOSIS — F419 Anxiety disorder, unspecified: Secondary | ICD-10-CM | POA: Diagnosis not present

## 2020-02-07 DIAGNOSIS — Z8546 Personal history of malignant neoplasm of prostate: Secondary | ICD-10-CM | POA: Diagnosis not present

## 2020-02-07 DIAGNOSIS — E7800 Pure hypercholesterolemia, unspecified: Secondary | ICD-10-CM | POA: Diagnosis not present

## 2020-02-22 DIAGNOSIS — E119 Type 2 diabetes mellitus without complications: Secondary | ICD-10-CM | POA: Diagnosis not present

## 2020-04-20 DIAGNOSIS — D2272 Melanocytic nevi of left lower limb, including hip: Secondary | ICD-10-CM | POA: Diagnosis not present

## 2020-04-20 DIAGNOSIS — X32XXXA Exposure to sunlight, initial encounter: Secondary | ICD-10-CM | POA: Diagnosis not present

## 2020-04-20 DIAGNOSIS — L821 Other seborrheic keratosis: Secondary | ICD-10-CM | POA: Diagnosis not present

## 2020-04-20 DIAGNOSIS — D2262 Melanocytic nevi of left upper limb, including shoulder: Secondary | ICD-10-CM | POA: Diagnosis not present

## 2020-04-20 DIAGNOSIS — D2261 Melanocytic nevi of right upper limb, including shoulder: Secondary | ICD-10-CM | POA: Diagnosis not present

## 2020-04-20 DIAGNOSIS — L538 Other specified erythematous conditions: Secondary | ICD-10-CM | POA: Diagnosis not present

## 2020-04-20 DIAGNOSIS — D225 Melanocytic nevi of trunk: Secondary | ICD-10-CM | POA: Diagnosis not present

## 2020-04-20 DIAGNOSIS — L570 Actinic keratosis: Secondary | ICD-10-CM | POA: Diagnosis not present

## 2020-04-20 DIAGNOSIS — B078 Other viral warts: Secondary | ICD-10-CM | POA: Diagnosis not present

## 2020-04-20 DIAGNOSIS — D485 Neoplasm of uncertain behavior of skin: Secondary | ICD-10-CM | POA: Diagnosis not present

## 2020-04-20 DIAGNOSIS — Z85828 Personal history of other malignant neoplasm of skin: Secondary | ICD-10-CM | POA: Diagnosis not present

## 2020-07-02 DIAGNOSIS — R0981 Nasal congestion: Secondary | ICD-10-CM | POA: Diagnosis not present

## 2020-07-02 DIAGNOSIS — Z20822 Contact with and (suspected) exposure to covid-19: Secondary | ICD-10-CM | POA: Diagnosis not present

## 2020-07-02 DIAGNOSIS — E119 Type 2 diabetes mellitus without complications: Secondary | ICD-10-CM | POA: Diagnosis not present

## 2020-07-20 DIAGNOSIS — G4730 Sleep apnea, unspecified: Secondary | ICD-10-CM | POA: Diagnosis not present

## 2020-08-03 DIAGNOSIS — E7800 Pure hypercholesterolemia, unspecified: Secondary | ICD-10-CM | POA: Diagnosis not present

## 2020-08-03 DIAGNOSIS — F419 Anxiety disorder, unspecified: Secondary | ICD-10-CM | POA: Diagnosis not present

## 2020-08-03 DIAGNOSIS — E119 Type 2 diabetes mellitus without complications: Secondary | ICD-10-CM | POA: Diagnosis not present

## 2020-08-03 DIAGNOSIS — Z8546 Personal history of malignant neoplasm of prostate: Secondary | ICD-10-CM | POA: Diagnosis not present

## 2020-08-03 DIAGNOSIS — I10 Essential (primary) hypertension: Secondary | ICD-10-CM | POA: Diagnosis not present

## 2020-08-03 DIAGNOSIS — G4733 Obstructive sleep apnea (adult) (pediatric): Secondary | ICD-10-CM | POA: Diagnosis not present

## 2020-08-03 DIAGNOSIS — Z9989 Dependence on other enabling machines and devices: Secondary | ICD-10-CM | POA: Diagnosis not present

## 2020-09-20 DIAGNOSIS — E119 Type 2 diabetes mellitus without complications: Secondary | ICD-10-CM | POA: Diagnosis not present

## 2020-12-24 DIAGNOSIS — E119 Type 2 diabetes mellitus without complications: Secondary | ICD-10-CM | POA: Diagnosis not present

## 2020-12-31 DIAGNOSIS — E119 Type 2 diabetes mellitus without complications: Secondary | ICD-10-CM | POA: Diagnosis not present

## 2021-02-21 DIAGNOSIS — Z9989 Dependence on other enabling machines and devices: Secondary | ICD-10-CM | POA: Diagnosis not present

## 2021-02-21 DIAGNOSIS — E7800 Pure hypercholesterolemia, unspecified: Secondary | ICD-10-CM | POA: Diagnosis not present

## 2021-02-21 DIAGNOSIS — E119 Type 2 diabetes mellitus without complications: Secondary | ICD-10-CM | POA: Diagnosis not present

## 2021-02-21 DIAGNOSIS — G4733 Obstructive sleep apnea (adult) (pediatric): Secondary | ICD-10-CM | POA: Diagnosis not present

## 2021-02-21 DIAGNOSIS — I10 Essential (primary) hypertension: Secondary | ICD-10-CM | POA: Diagnosis not present

## 2021-02-21 DIAGNOSIS — Z8546 Personal history of malignant neoplasm of prostate: Secondary | ICD-10-CM | POA: Diagnosis not present

## 2021-02-21 DIAGNOSIS — Z125 Encounter for screening for malignant neoplasm of prostate: Secondary | ICD-10-CM | POA: Diagnosis not present

## 2021-02-21 DIAGNOSIS — F419 Anxiety disorder, unspecified: Secondary | ICD-10-CM | POA: Diagnosis not present

## 2021-02-21 DIAGNOSIS — Z0000 Encounter for general adult medical examination without abnormal findings: Secondary | ICD-10-CM | POA: Diagnosis not present

## 2021-04-15 DIAGNOSIS — T7840XA Allergy, unspecified, initial encounter: Secondary | ICD-10-CM | POA: Diagnosis not present

## 2021-04-29 DIAGNOSIS — L308 Other specified dermatitis: Secondary | ICD-10-CM | POA: Diagnosis not present

## 2021-04-29 DIAGNOSIS — L309 Dermatitis, unspecified: Secondary | ICD-10-CM | POA: Diagnosis not present

## 2021-05-13 DIAGNOSIS — X32XXXA Exposure to sunlight, initial encounter: Secondary | ICD-10-CM | POA: Diagnosis not present

## 2021-05-13 DIAGNOSIS — L2084 Intrinsic (allergic) eczema: Secondary | ICD-10-CM | POA: Diagnosis not present

## 2021-05-13 DIAGNOSIS — D225 Melanocytic nevi of trunk: Secondary | ICD-10-CM | POA: Diagnosis not present

## 2021-05-13 DIAGNOSIS — Z85828 Personal history of other malignant neoplasm of skin: Secondary | ICD-10-CM | POA: Diagnosis not present

## 2021-05-13 DIAGNOSIS — D2261 Melanocytic nevi of right upper limb, including shoulder: Secondary | ICD-10-CM | POA: Diagnosis not present

## 2021-05-13 DIAGNOSIS — L570 Actinic keratosis: Secondary | ICD-10-CM | POA: Diagnosis not present

## 2021-05-13 DIAGNOSIS — D2262 Melanocytic nevi of left upper limb, including shoulder: Secondary | ICD-10-CM | POA: Diagnosis not present

## 2021-05-29 DIAGNOSIS — G4730 Sleep apnea, unspecified: Secondary | ICD-10-CM | POA: Diagnosis not present

## 2021-07-05 DIAGNOSIS — E119 Type 2 diabetes mellitus without complications: Secondary | ICD-10-CM | POA: Diagnosis not present

## 2021-07-11 DIAGNOSIS — R051 Acute cough: Secondary | ICD-10-CM | POA: Diagnosis not present

## 2021-07-11 DIAGNOSIS — Z9989 Dependence on other enabling machines and devices: Secondary | ICD-10-CM | POA: Diagnosis not present

## 2021-07-11 DIAGNOSIS — R059 Cough, unspecified: Secondary | ICD-10-CM | POA: Diagnosis not present

## 2021-07-11 DIAGNOSIS — G4733 Obstructive sleep apnea (adult) (pediatric): Secondary | ICD-10-CM | POA: Diagnosis not present

## 2021-08-26 DIAGNOSIS — F419 Anxiety disorder, unspecified: Secondary | ICD-10-CM | POA: Diagnosis not present

## 2021-08-26 DIAGNOSIS — G4733 Obstructive sleep apnea (adult) (pediatric): Secondary | ICD-10-CM | POA: Diagnosis not present

## 2021-08-26 DIAGNOSIS — E119 Type 2 diabetes mellitus without complications: Secondary | ICD-10-CM | POA: Diagnosis not present

## 2021-08-26 DIAGNOSIS — I10 Essential (primary) hypertension: Secondary | ICD-10-CM | POA: Diagnosis not present

## 2021-08-26 DIAGNOSIS — Z9989 Dependence on other enabling machines and devices: Secondary | ICD-10-CM | POA: Diagnosis not present

## 2021-08-26 DIAGNOSIS — E7800 Pure hypercholesterolemia, unspecified: Secondary | ICD-10-CM | POA: Diagnosis not present

## 2021-08-26 DIAGNOSIS — Z8546 Personal history of malignant neoplasm of prostate: Secondary | ICD-10-CM | POA: Diagnosis not present

## 2021-09-25 DIAGNOSIS — D2310 Other benign neoplasm of skin of unspecified eyelid, including canthus: Secondary | ICD-10-CM | POA: Diagnosis not present

## 2021-10-28 DIAGNOSIS — X32XXXA Exposure to sunlight, initial encounter: Secondary | ICD-10-CM | POA: Diagnosis not present

## 2021-10-28 DIAGNOSIS — D2271 Melanocytic nevi of right lower limb, including hip: Secondary | ICD-10-CM | POA: Diagnosis not present

## 2021-10-28 DIAGNOSIS — L570 Actinic keratosis: Secondary | ICD-10-CM | POA: Diagnosis not present

## 2021-10-28 DIAGNOSIS — Z85828 Personal history of other malignant neoplasm of skin: Secondary | ICD-10-CM | POA: Diagnosis not present

## 2021-10-28 DIAGNOSIS — D2262 Melanocytic nevi of left upper limb, including shoulder: Secondary | ICD-10-CM | POA: Diagnosis not present

## 2021-10-28 DIAGNOSIS — D2272 Melanocytic nevi of left lower limb, including hip: Secondary | ICD-10-CM | POA: Diagnosis not present

## 2021-11-21 DIAGNOSIS — S76111A Strain of right quadriceps muscle, fascia and tendon, initial encounter: Secondary | ICD-10-CM | POA: Diagnosis not present

## 2021-11-27 DIAGNOSIS — G4730 Sleep apnea, unspecified: Secondary | ICD-10-CM | POA: Diagnosis not present

## 2022-03-06 DIAGNOSIS — G4733 Obstructive sleep apnea (adult) (pediatric): Secondary | ICD-10-CM | POA: Diagnosis not present

## 2022-03-06 DIAGNOSIS — Z8546 Personal history of malignant neoplasm of prostate: Secondary | ICD-10-CM | POA: Diagnosis not present

## 2022-03-06 DIAGNOSIS — I10 Essential (primary) hypertension: Secondary | ICD-10-CM | POA: Diagnosis not present

## 2022-03-06 DIAGNOSIS — E119 Type 2 diabetes mellitus without complications: Secondary | ICD-10-CM | POA: Diagnosis not present

## 2022-03-06 DIAGNOSIS — E1165 Type 2 diabetes mellitus with hyperglycemia: Secondary | ICD-10-CM | POA: Diagnosis not present

## 2022-03-06 DIAGNOSIS — F419 Anxiety disorder, unspecified: Secondary | ICD-10-CM | POA: Diagnosis not present

## 2022-03-06 DIAGNOSIS — E7800 Pure hypercholesterolemia, unspecified: Secondary | ICD-10-CM | POA: Diagnosis not present

## 2022-03-06 DIAGNOSIS — Z0000 Encounter for general adult medical examination without abnormal findings: Secondary | ICD-10-CM | POA: Diagnosis not present

## 2022-03-06 DIAGNOSIS — Z9989 Dependence on other enabling machines and devices: Secondary | ICD-10-CM | POA: Diagnosis not present

## 2022-03-13 DIAGNOSIS — E119 Type 2 diabetes mellitus without complications: Secondary | ICD-10-CM | POA: Diagnosis not present

## 2022-05-05 DIAGNOSIS — D485 Neoplasm of uncertain behavior of skin: Secondary | ICD-10-CM | POA: Diagnosis not present

## 2022-05-05 DIAGNOSIS — D2272 Melanocytic nevi of left lower limb, including hip: Secondary | ICD-10-CM | POA: Diagnosis not present

## 2022-05-05 DIAGNOSIS — L570 Actinic keratosis: Secondary | ICD-10-CM | POA: Diagnosis not present

## 2022-05-05 DIAGNOSIS — X32XXXA Exposure to sunlight, initial encounter: Secondary | ICD-10-CM | POA: Diagnosis not present

## 2022-05-05 DIAGNOSIS — Z85828 Personal history of other malignant neoplasm of skin: Secondary | ICD-10-CM | POA: Diagnosis not present

## 2022-05-05 DIAGNOSIS — D2261 Melanocytic nevi of right upper limb, including shoulder: Secondary | ICD-10-CM | POA: Diagnosis not present

## 2022-05-05 DIAGNOSIS — D2262 Melanocytic nevi of left upper limb, including shoulder: Secondary | ICD-10-CM | POA: Diagnosis not present

## 2022-05-05 DIAGNOSIS — D0421 Carcinoma in situ of skin of right ear and external auricular canal: Secondary | ICD-10-CM | POA: Diagnosis not present

## 2022-05-29 DIAGNOSIS — G4733 Obstructive sleep apnea (adult) (pediatric): Secondary | ICD-10-CM | POA: Diagnosis not present

## 2022-07-02 DIAGNOSIS — D0421 Carcinoma in situ of skin of right ear and external auricular canal: Secondary | ICD-10-CM | POA: Diagnosis not present

## 2022-07-02 DIAGNOSIS — D044 Carcinoma in situ of skin of scalp and neck: Secondary | ICD-10-CM | POA: Diagnosis not present

## 2022-09-10 DIAGNOSIS — E119 Type 2 diabetes mellitus without complications: Secondary | ICD-10-CM | POA: Diagnosis not present

## 2022-09-16 DIAGNOSIS — G4733 Obstructive sleep apnea (adult) (pediatric): Secondary | ICD-10-CM | POA: Diagnosis not present

## 2022-09-16 DIAGNOSIS — E7800 Pure hypercholesterolemia, unspecified: Secondary | ICD-10-CM | POA: Diagnosis not present

## 2022-09-16 DIAGNOSIS — Z8546 Personal history of malignant neoplasm of prostate: Secondary | ICD-10-CM | POA: Diagnosis not present

## 2022-09-16 DIAGNOSIS — I10 Essential (primary) hypertension: Secondary | ICD-10-CM | POA: Diagnosis not present

## 2022-09-16 DIAGNOSIS — E119 Type 2 diabetes mellitus without complications: Secondary | ICD-10-CM | POA: Diagnosis not present

## 2022-09-16 DIAGNOSIS — F419 Anxiety disorder, unspecified: Secondary | ICD-10-CM | POA: Diagnosis not present

## 2022-09-17 DIAGNOSIS — E1165 Type 2 diabetes mellitus with hyperglycemia: Secondary | ICD-10-CM | POA: Diagnosis not present

## 2022-10-08 DIAGNOSIS — E119 Type 2 diabetes mellitus without complications: Secondary | ICD-10-CM | POA: Diagnosis not present

## 2022-10-08 DIAGNOSIS — D2310 Other benign neoplasm of skin of unspecified eyelid, including canthus: Secondary | ICD-10-CM | POA: Diagnosis not present

## 2022-10-08 DIAGNOSIS — H2513 Age-related nuclear cataract, bilateral: Secondary | ICD-10-CM | POA: Diagnosis not present

## 2022-11-11 DIAGNOSIS — D2271 Melanocytic nevi of right lower limb, including hip: Secondary | ICD-10-CM | POA: Diagnosis not present

## 2022-11-11 DIAGNOSIS — C44519 Basal cell carcinoma of skin of other part of trunk: Secondary | ICD-10-CM | POA: Diagnosis not present

## 2022-11-11 DIAGNOSIS — D2262 Melanocytic nevi of left upper limb, including shoulder: Secondary | ICD-10-CM | POA: Diagnosis not present

## 2022-11-11 DIAGNOSIS — Z85828 Personal history of other malignant neoplasm of skin: Secondary | ICD-10-CM | POA: Diagnosis not present

## 2022-11-11 DIAGNOSIS — D485 Neoplasm of uncertain behavior of skin: Secondary | ICD-10-CM | POA: Diagnosis not present

## 2022-11-11 DIAGNOSIS — C44319 Basal cell carcinoma of skin of other parts of face: Secondary | ICD-10-CM | POA: Diagnosis not present

## 2022-11-11 DIAGNOSIS — D2261 Melanocytic nevi of right upper limb, including shoulder: Secondary | ICD-10-CM | POA: Diagnosis not present

## 2022-11-11 DIAGNOSIS — D2272 Melanocytic nevi of left lower limb, including hip: Secondary | ICD-10-CM | POA: Diagnosis not present

## 2022-11-11 DIAGNOSIS — Z08 Encounter for follow-up examination after completed treatment for malignant neoplasm: Secondary | ICD-10-CM | POA: Diagnosis not present

## 2022-11-11 DIAGNOSIS — L570 Actinic keratosis: Secondary | ICD-10-CM | POA: Diagnosis not present

## 2022-11-21 DIAGNOSIS — C44519 Basal cell carcinoma of skin of other part of trunk: Secondary | ICD-10-CM | POA: Diagnosis not present

## 2022-12-18 DIAGNOSIS — L578 Other skin changes due to chronic exposure to nonionizing radiation: Secondary | ICD-10-CM | POA: Diagnosis not present

## 2022-12-18 DIAGNOSIS — L814 Other melanin hyperpigmentation: Secondary | ICD-10-CM | POA: Diagnosis not present

## 2022-12-18 DIAGNOSIS — C44319 Basal cell carcinoma of skin of other parts of face: Secondary | ICD-10-CM | POA: Diagnosis not present

## 2022-12-24 DIAGNOSIS — E1165 Type 2 diabetes mellitus with hyperglycemia: Secondary | ICD-10-CM | POA: Diagnosis not present

## 2023-02-01 DIAGNOSIS — L03113 Cellulitis of right upper limb: Secondary | ICD-10-CM | POA: Diagnosis not present

## 2023-04-01 DIAGNOSIS — E1159 Type 2 diabetes mellitus with other circulatory complications: Secondary | ICD-10-CM | POA: Diagnosis not present

## 2023-04-01 DIAGNOSIS — I152 Hypertension secondary to endocrine disorders: Secondary | ICD-10-CM | POA: Diagnosis not present

## 2023-04-01 DIAGNOSIS — E1165 Type 2 diabetes mellitus with hyperglycemia: Secondary | ICD-10-CM | POA: Diagnosis not present

## 2023-04-01 DIAGNOSIS — E785 Hyperlipidemia, unspecified: Secondary | ICD-10-CM | POA: Diagnosis not present

## 2023-04-01 DIAGNOSIS — E1169 Type 2 diabetes mellitus with other specified complication: Secondary | ICD-10-CM | POA: Diagnosis not present

## 2023-04-22 DIAGNOSIS — Z03818 Encounter for observation for suspected exposure to other biological agents ruled out: Secondary | ICD-10-CM | POA: Diagnosis not present

## 2023-04-22 DIAGNOSIS — J209 Acute bronchitis, unspecified: Secondary | ICD-10-CM | POA: Diagnosis not present

## 2023-04-22 DIAGNOSIS — J101 Influenza due to other identified influenza virus with other respiratory manifestations: Secondary | ICD-10-CM | POA: Diagnosis not present

## 2023-04-22 DIAGNOSIS — B9689 Other specified bacterial agents as the cause of diseases classified elsewhere: Secondary | ICD-10-CM | POA: Diagnosis not present

## 2023-04-22 DIAGNOSIS — J019 Acute sinusitis, unspecified: Secondary | ICD-10-CM | POA: Diagnosis not present

## 2023-05-13 DIAGNOSIS — J209 Acute bronchitis, unspecified: Secondary | ICD-10-CM | POA: Diagnosis not present

## 2023-05-13 DIAGNOSIS — J329 Chronic sinusitis, unspecified: Secondary | ICD-10-CM | POA: Diagnosis not present

## 2023-05-30 DIAGNOSIS — G4733 Obstructive sleep apnea (adult) (pediatric): Secondary | ICD-10-CM | POA: Diagnosis not present

## 2023-06-08 DIAGNOSIS — Z0000 Encounter for general adult medical examination without abnormal findings: Secondary | ICD-10-CM | POA: Diagnosis not present

## 2023-06-08 DIAGNOSIS — E7800 Pure hypercholesterolemia, unspecified: Secondary | ICD-10-CM | POA: Diagnosis not present

## 2023-06-08 DIAGNOSIS — Z8546 Personal history of malignant neoplasm of prostate: Secondary | ICD-10-CM | POA: Diagnosis not present

## 2023-06-08 DIAGNOSIS — G4733 Obstructive sleep apnea (adult) (pediatric): Secondary | ICD-10-CM | POA: Diagnosis not present

## 2023-06-08 DIAGNOSIS — F419 Anxiety disorder, unspecified: Secondary | ICD-10-CM | POA: Diagnosis not present

## 2023-06-08 DIAGNOSIS — R202 Paresthesia of skin: Secondary | ICD-10-CM | POA: Diagnosis not present

## 2023-06-08 DIAGNOSIS — E119 Type 2 diabetes mellitus without complications: Secondary | ICD-10-CM | POA: Diagnosis not present

## 2023-06-08 DIAGNOSIS — Z23 Encounter for immunization: Secondary | ICD-10-CM | POA: Diagnosis not present

## 2023-06-08 DIAGNOSIS — I10 Essential (primary) hypertension: Secondary | ICD-10-CM | POA: Diagnosis not present

## 2023-06-26 DIAGNOSIS — D225 Melanocytic nevi of trunk: Secondary | ICD-10-CM | POA: Diagnosis not present

## 2023-06-26 DIAGNOSIS — X32XXXA Exposure to sunlight, initial encounter: Secondary | ICD-10-CM | POA: Diagnosis not present

## 2023-06-26 DIAGNOSIS — D2272 Melanocytic nevi of left lower limb, including hip: Secondary | ICD-10-CM | POA: Diagnosis not present

## 2023-06-26 DIAGNOSIS — L570 Actinic keratosis: Secondary | ICD-10-CM | POA: Diagnosis not present

## 2023-06-26 DIAGNOSIS — D2271 Melanocytic nevi of right lower limb, including hip: Secondary | ICD-10-CM | POA: Diagnosis not present

## 2023-06-26 DIAGNOSIS — D2261 Melanocytic nevi of right upper limb, including shoulder: Secondary | ICD-10-CM | POA: Diagnosis not present

## 2023-06-26 DIAGNOSIS — C44311 Basal cell carcinoma of skin of nose: Secondary | ICD-10-CM | POA: Diagnosis not present

## 2023-06-26 DIAGNOSIS — D2262 Melanocytic nevi of left upper limb, including shoulder: Secondary | ICD-10-CM | POA: Diagnosis not present

## 2023-06-26 DIAGNOSIS — Z85828 Personal history of other malignant neoplasm of skin: Secondary | ICD-10-CM | POA: Diagnosis not present

## 2023-06-26 DIAGNOSIS — L821 Other seborrheic keratosis: Secondary | ICD-10-CM | POA: Diagnosis not present

## 2023-06-26 DIAGNOSIS — D485 Neoplasm of uncertain behavior of skin: Secondary | ICD-10-CM | POA: Diagnosis not present

## 2023-09-15 DIAGNOSIS — I10 Essential (primary) hypertension: Secondary | ICD-10-CM | POA: Diagnosis not present

## 2023-09-15 DIAGNOSIS — E7800 Pure hypercholesterolemia, unspecified: Secondary | ICD-10-CM | POA: Diagnosis not present

## 2023-09-15 DIAGNOSIS — F419 Anxiety disorder, unspecified: Secondary | ICD-10-CM | POA: Diagnosis not present

## 2023-09-15 DIAGNOSIS — E119 Type 2 diabetes mellitus without complications: Secondary | ICD-10-CM | POA: Diagnosis not present

## 2023-09-28 DIAGNOSIS — E1165 Type 2 diabetes mellitus with hyperglycemia: Secondary | ICD-10-CM | POA: Diagnosis not present

## 2023-10-05 DIAGNOSIS — L814 Other melanin hyperpigmentation: Secondary | ICD-10-CM | POA: Diagnosis not present

## 2023-10-05 DIAGNOSIS — L578 Other skin changes due to chronic exposure to nonionizing radiation: Secondary | ICD-10-CM | POA: Diagnosis not present

## 2023-10-05 DIAGNOSIS — C44311 Basal cell carcinoma of skin of nose: Secondary | ICD-10-CM | POA: Diagnosis not present

## 2023-10-06 DIAGNOSIS — E1169 Type 2 diabetes mellitus with other specified complication: Secondary | ICD-10-CM | POA: Diagnosis not present

## 2023-10-06 DIAGNOSIS — E785 Hyperlipidemia, unspecified: Secondary | ICD-10-CM | POA: Diagnosis not present

## 2023-10-06 DIAGNOSIS — E1159 Type 2 diabetes mellitus with other circulatory complications: Secondary | ICD-10-CM | POA: Diagnosis not present

## 2023-10-06 DIAGNOSIS — I152 Hypertension secondary to endocrine disorders: Secondary | ICD-10-CM | POA: Diagnosis not present

## 2023-10-06 DIAGNOSIS — E1165 Type 2 diabetes mellitus with hyperglycemia: Secondary | ICD-10-CM | POA: Diagnosis not present

## 2023-10-08 DIAGNOSIS — E119 Type 2 diabetes mellitus without complications: Secondary | ICD-10-CM | POA: Diagnosis not present

## 2023-10-08 DIAGNOSIS — H4321 Crystalline deposits in vitreous body, right eye: Secondary | ICD-10-CM | POA: Diagnosis not present

## 2023-10-08 DIAGNOSIS — H2513 Age-related nuclear cataract, bilateral: Secondary | ICD-10-CM | POA: Diagnosis not present

## 2023-10-08 DIAGNOSIS — D2310 Other benign neoplasm of skin of unspecified eyelid, including canthus: Secondary | ICD-10-CM | POA: Diagnosis not present

## 2023-11-09 DIAGNOSIS — J0100 Acute maxillary sinusitis, unspecified: Secondary | ICD-10-CM | POA: Diagnosis not present

## 2023-11-28 DIAGNOSIS — G4733 Obstructive sleep apnea (adult) (pediatric): Secondary | ICD-10-CM | POA: Diagnosis not present

## 2024-01-14 DIAGNOSIS — Z1331 Encounter for screening for depression: Secondary | ICD-10-CM | POA: Diagnosis not present

## 2024-01-14 DIAGNOSIS — G4733 Obstructive sleep apnea (adult) (pediatric): Secondary | ICD-10-CM | POA: Diagnosis not present

## 2024-01-14 DIAGNOSIS — E7800 Pure hypercholesterolemia, unspecified: Secondary | ICD-10-CM | POA: Diagnosis not present

## 2024-01-14 DIAGNOSIS — E119 Type 2 diabetes mellitus without complications: Secondary | ICD-10-CM | POA: Diagnosis not present

## 2024-01-14 DIAGNOSIS — H9193 Unspecified hearing loss, bilateral: Secondary | ICD-10-CM | POA: Diagnosis not present

## 2024-01-14 DIAGNOSIS — F419 Anxiety disorder, unspecified: Secondary | ICD-10-CM | POA: Diagnosis not present

## 2024-01-14 DIAGNOSIS — Z8546 Personal history of malignant neoplasm of prostate: Secondary | ICD-10-CM | POA: Diagnosis not present

## 2024-01-14 DIAGNOSIS — Z0000 Encounter for general adult medical examination without abnormal findings: Secondary | ICD-10-CM | POA: Diagnosis not present

## 2024-01-14 DIAGNOSIS — I10 Essential (primary) hypertension: Secondary | ICD-10-CM | POA: Diagnosis not present

## 2024-02-02 DIAGNOSIS — H903 Sensorineural hearing loss, bilateral: Secondary | ICD-10-CM | POA: Diagnosis not present

## 2024-02-05 DIAGNOSIS — D2272 Melanocytic nevi of left lower limb, including hip: Secondary | ICD-10-CM | POA: Diagnosis not present

## 2024-02-05 DIAGNOSIS — C44519 Basal cell carcinoma of skin of other part of trunk: Secondary | ICD-10-CM | POA: Diagnosis not present

## 2024-02-05 DIAGNOSIS — D2261 Melanocytic nevi of right upper limb, including shoulder: Secondary | ICD-10-CM | POA: Diagnosis not present

## 2024-02-05 DIAGNOSIS — Z08 Encounter for follow-up examination after completed treatment for malignant neoplasm: Secondary | ICD-10-CM | POA: Diagnosis not present

## 2024-02-05 DIAGNOSIS — D044 Carcinoma in situ of skin of scalp and neck: Secondary | ICD-10-CM | POA: Diagnosis not present

## 2024-02-05 DIAGNOSIS — D225 Melanocytic nevi of trunk: Secondary | ICD-10-CM | POA: Diagnosis not present

## 2024-02-05 DIAGNOSIS — L814 Other melanin hyperpigmentation: Secondary | ICD-10-CM | POA: Diagnosis not present

## 2024-02-05 DIAGNOSIS — L570 Actinic keratosis: Secondary | ICD-10-CM | POA: Diagnosis not present

## 2024-02-05 DIAGNOSIS — D2271 Melanocytic nevi of right lower limb, including hip: Secondary | ICD-10-CM | POA: Diagnosis not present

## 2024-02-05 DIAGNOSIS — D485 Neoplasm of uncertain behavior of skin: Secondary | ICD-10-CM | POA: Diagnosis not present

## 2024-02-05 DIAGNOSIS — L821 Other seborrheic keratosis: Secondary | ICD-10-CM | POA: Diagnosis not present

## 2024-02-05 DIAGNOSIS — C4441 Basal cell carcinoma of skin of scalp and neck: Secondary | ICD-10-CM | POA: Diagnosis not present

## 2024-02-05 DIAGNOSIS — Z85828 Personal history of other malignant neoplasm of skin: Secondary | ICD-10-CM | POA: Diagnosis not present

## 2024-02-05 DIAGNOSIS — D0462 Carcinoma in situ of skin of left upper limb, including shoulder: Secondary | ICD-10-CM | POA: Diagnosis not present

## 2024-02-05 DIAGNOSIS — D2262 Melanocytic nevi of left upper limb, including shoulder: Secondary | ICD-10-CM | POA: Diagnosis not present

## 2024-03-23 DIAGNOSIS — D0462 Carcinoma in situ of skin of left upper limb, including shoulder: Secondary | ICD-10-CM | POA: Diagnosis not present

## 2024-03-23 DIAGNOSIS — D044 Carcinoma in situ of skin of scalp and neck: Secondary | ICD-10-CM | POA: Diagnosis not present

## 2024-03-23 DIAGNOSIS — C44519 Basal cell carcinoma of skin of other part of trunk: Secondary | ICD-10-CM | POA: Diagnosis not present

## 2024-03-23 DIAGNOSIS — C4441 Basal cell carcinoma of skin of scalp and neck: Secondary | ICD-10-CM | POA: Diagnosis not present

## 2024-03-31 DIAGNOSIS — E1165 Type 2 diabetes mellitus with hyperglycemia: Secondary | ICD-10-CM | POA: Diagnosis not present

## 2024-04-07 DIAGNOSIS — E1159 Type 2 diabetes mellitus with other circulatory complications: Secondary | ICD-10-CM | POA: Diagnosis not present

## 2024-04-07 DIAGNOSIS — E785 Hyperlipidemia, unspecified: Secondary | ICD-10-CM | POA: Diagnosis not present

## 2024-04-07 DIAGNOSIS — E1169 Type 2 diabetes mellitus with other specified complication: Secondary | ICD-10-CM | POA: Diagnosis not present

## 2024-04-07 DIAGNOSIS — E1165 Type 2 diabetes mellitus with hyperglycemia: Secondary | ICD-10-CM | POA: Diagnosis not present

## 2024-04-07 DIAGNOSIS — I152 Hypertension secondary to endocrine disorders: Secondary | ICD-10-CM | POA: Diagnosis not present
